# Patient Record
Sex: Male | Born: 2000 | Race: White | Hispanic: Yes | Marital: Single | Smoking: Never smoker
Health system: Southern US, Community
[De-identification: ages and names within clinical notes are randomized; demographics above are authoritative.]

---

## 2022-01-07 ENCOUNTER — Emergency Department (HOSPITAL_COMMUNITY)
Admission: EM | Admit: 2022-01-07 | Discharge: 2022-01-08 | Disposition: A | Discharge status: Home / Self Care | Payer: Self-pay | Attending: Emergency Medicine | Admitting: Emergency Medicine

## 2022-01-07 ENCOUNTER — Encounter (HOSPITAL_COMMUNITY): Payer: Self-pay

## 2022-01-07 ENCOUNTER — Emergency Department (HOSPITAL_COMMUNITY): Payer: Self-pay

## 2022-01-07 ENCOUNTER — Other Ambulatory Visit: Payer: Self-pay

## 2022-01-07 DIAGNOSIS — Y9361 Activity, american tackle football: Secondary | ICD-10-CM | POA: Insufficient documentation

## 2022-01-07 DIAGNOSIS — X501XXA Overexertion from prolonged static or awkward postures, initial encounter: Secondary | ICD-10-CM | POA: Insufficient documentation

## 2022-01-07 DIAGNOSIS — M25562 Pain in left knee: Secondary | ICD-10-CM | POA: Insufficient documentation

## 2022-01-07 MED ORDER — IBUPROFEN 800 MG PO TABS
800.0000 mg | ORAL_TABLET | Freq: Once | ORAL | Status: AC
  Administered 2022-01-07: 800 mg via ORAL
  Filled 2022-01-07: qty 1

## 2022-01-07 NOTE — ED Provider Triage Note (Signed)
Emergency Medicine Provider Triage Evaluation Note ? ?Sergio Wagner , a 21 y.o. male  was evaluated in triage.  Pt complains of left knee pain S/p injury while playing flag football.  Patient states that he was running in his left knee that he went to different directions.  He is having pain to the medial knee, worse with movement, no alleviating factors.. ? ?Review of Systems  ?Positive: Knee pain ?Negative: Numbness, tingling, weakness ? ?Physical Exam  ?BP (!) 144/85 (BP Location: Right Arm)   Pulse 65   Temp 100.3 ?F (37.9 ?C) (Oral)   Resp 16   SpO2 100%  ?Gen:   Awake, no distress   ?Resp:  Normal effort  ?MSK:   Left lower extremity: No significant open wounds.  Intact active range of motion throughout with exception of left knee, flexion/extension mildly limited however is able to move some each direction.  Tender to the medial aspect of the knee.  2+ DP and PT pulses.  Sensation grossly tact bilateral lower extremities.  5-5 strength with plantar dorsiflexion bilaterally. ? ?Medical Decision Making  ?Medically screening exam initiated at 11:37 PM.  Appropriate orders placed.  Sergio Wagner was informed that the remainder of the evaluation will be completed by another provider, this initial triage assessment does not replace that evaluation, and the importance of remaining in the ED until their evaluation is complete. ? ?Knee pain ?  ?Cherly Anderson, PA-C ?01/07/22 2338 ? ?

## 2022-01-07 NOTE — ED Triage Notes (Signed)
Was playing flag footbaLL this evening and he injured the left knee ?

## 2022-01-08 MED ORDER — NAPROXEN 500 MG PO TABS: 500.0000 mg | ORAL_TABLET | Freq: Two times a day (BID) | ORAL | 0 refills | Status: AC | PRN

## 2022-01-08 NOTE — ED Notes (Signed)
All discharge instructions including follow up are and prescriptions reviewed with patient and patient verbalized understanding of same. Patient stable and ambulatory with crutches at time of discharge. Wheel chair offered but patient declined.  ?

## 2022-01-08 NOTE — ED Provider Notes (Signed)
?[facility] EMERGENCY DEPARTMENT ?Provider Note ? ? ?CSN: [id number] ?Arrival date & time: 01/07/22  2248 ? ?  ? ?History ? ?Chief Complaint  ?Patient presents with  ? Knee Injury  ? ? ?Sergio Wagner is a 21 y.o. male without significant past medical history who presents to the emergency department with complaints of left knee pain status post injury while playing flag football earlier today.  Patient states he was running, jump, and when he landed felt like his knee twisted.  He has had pain to the medial aspect of the knee since injury, worse with movement, no alleviating factors.  He denies any other areas of injury.  He denies numbness, tingling, or weakness. ? ?HPI ? ?  ? ?Home Medications ?Prior to Admission medications   ?Medication Sig Start Date End Date Taking? Authorizing Provider  ?naproxen (NAPROSYN) 500 MG tablet Take 1 tablet (500 mg total) by mouth 2 (two) times daily as needed for moderate pain. 01/08/22  Yes Rhyanna Sorce, Pleas Koch, PA-C  ?   ? ?Allergies    ?Patient has no allergy information on record.   ? ?Review of Systems   ?Review of Systems  ?Constitutional:  Negative for chills and fever.  ?Respiratory:  Negative for shortness of breath.   ?Cardiovascular:  Negative for chest pain.  ?Gastrointestinal:  Negative for abdominal pain and vomiting.  ?Musculoskeletal:  Positive for arthralgias.  ?Neurological:  Negative for weakness and numbness.  ?All other systems reviewed and are negative. ? ?Physical Exam ?Updated Vital Signs ?BP (!) 144/85 (BP Location: Right Arm)   Pulse 65   Temp 100.3 ?F (37.9 ?C) (Oral)   Resp 16   Ht 5\' 5"  (1.651 m)   Wt 63.5 kg   SpO2 100%   BMI 23.30 kg/m?  ?Physical Exam ?Vitals and nursing note reviewed.  ?Constitutional:   ?   General: He is not in acute distress. ?   Appearance: He is not ill-appearing or toxic-appearing.  ?HENT:  ?   Head: Normocephalic and atraumatic.  ?Cardiovascular:  ?   Pulses:     ?     Dorsalis pedis pulses are  2+ on the right side and 2+ on the left side.  ?     Posterior tibial pulses are 2+ on the right side and 2+ on the left side.  ?Pulmonary:  ?   Effort: Pulmonary effort is normal.  ?Musculoskeletal:  ?   Comments: Lower extremities: No obvious deformity, appreciable swelling, edema, erythema, ecchymosis, warmth, or open wounds. Patient has intact AROM to bilateral hips, ankles, and all digits as well as to the right knee, left knee is able to be fully extended, however has some mild limitation in flexion, able to flex to about 90 degrees.. Tender to palpation to the medial aspect of the left knee over the medial joint line.  No other tenderness to palpation.  Some pain with valgus stress test, no obvious joint instability.   ?Skin: ?   General: Skin is warm and dry.  ?   Capillary Refill: Capillary refill takes less than 2 seconds.  ?Neurological:  ?   Mental Status: He is alert.  ?   Comments: Alert. Clear speech. Sensation grossly intact to bilateral lower extremities. 5/5 strength with plantar/dorsiflexion bilaterally.  ?Psychiatric:     ?   Mood and Affect: Mood normal.     ?   Behavior: Behavior normal.  ? ? ?ED Results / Procedures / Treatments   ?Labs ?(all  labs ordered are listed, but only abnormal results are displayed) ?Labs Reviewed - No data to display ? ?EKG ?None ? ?Radiology ?DG Knee Complete 4 Views Left ? ?Result Date: 01/07/2022 ?CLINICAL DATA:  Left knee pain after injury playing football today. EXAM: LEFT KNEE - COMPLETE 4+ VIEW COMPARISON:  None. FINDINGS: No evidence of fracture, dislocation, or joint effusion. Normal alignment and joint spaces. No evidence of arthropathy or other focal bone abnormality. Soft tissues are unremarkable. IMPRESSION: Negative radiographs of the left knee. Electronically Signed   By: Narda Rutherford M.D.   On: 01/07/2022 23:58   ? ?Procedures ?Procedures  ? ? ?Medications Ordered in ED ?Medications  ?ibuprofen (ADVIL) tablet 800 mg (800 mg Oral Given 01/07/22 2341)   ? ? ?ED Course/ Medical Decision Making/ A&P ?  ?                        ?Medical Decision Making ?Amount and/or Complexity of Data Reviewed ?Radiology: ordered. ? ?Risk ?Prescription drug management. ? ? ?Patient presents to the emergency department with complaints of left knee pain status post injury.  Nontoxic, resting comfortably, vitals with elevated BP, doubt hypertensive emergency, initial oral temp recorded 100.3, recheck temp recently was 98.7-patient has not had fever or additional symptoms at home.  ? ?Chart and nursing notes reviewed for additional history. ? ?I ordered an x-ray of the left knee which I viewed and interpreted, agree with radiologist interpretation-negative radiographs of the left knee ? ?Patient is neurovascularly intact distally.  There are no overlying signs of infection.  Possible ligamentous/meniscal injury.  Will place in knee immobilizer, provide crutches, and have patient follow-up with orthopedics. Discussed PRICE, prescription provided for naproxen. I discussed results, treatment plan, need for follow-up, and return precautions with the patient. Provided opportunity for questions, patient confirmed understanding and is in agreement with plan.  ? ?Final Clinical Impression(s) / ED Diagnoses ?Final diagnoses:  ?Acute pain of left knee  ? ? ?Rx / DC Orders ?ED Discharge Orders   ? ?      Ordered  ?  naproxen (NAPROSYN) 500 MG tablet  2 times daily PRN       ? 01/08/22 0125  ? ?  ?  ? ?  ? ? ?  ?Cherly Anderson, PA-C ?01/08/22 0132 ? ?  ?Sabas Sous, MD ?01/08/22 [phone] ? ?

## 2022-01-08 NOTE — Discharge Instructions (Signed)
Please read and follow all provided instructions. ? ?You have been seen today for an injury to your left knee ? ?Tests performed today include: ?An x-ray of the affected area - does NOT show any broken bones or dislocations.  ?Vital signs. See below for your results today.  ? ?Home care instructions: -- *PRICE in the first 24-48 hours after injury: ?Protect (with brace, splint, sling), if given by your provider ?Rest ?Ice- Do not apply ice pack directly to your skin, place towel or similar between your skin and ice/ice pack. Apply ice for 20 min, then remove for 40 min while awake ?Compression- Wear brace, elastic bandage, splint as directed by your provider ?Elevate affected extremity above the level of your heart when not walking around for the first 24-48 hours  ? ?Medications:  ?- Naproxen is a nonsteroidal anti-inflammatory medication that will help with pain and swelling. Be sure to take this medication as prescribed with food, 1 pill every 12 hours,  It should be taken with food, as it can cause stomach upset, and more seriously, stomach bleeding. Do not take other nonsteroidal anti-inflammatory medications with this such as Advil, Motrin, Aleve, Mobic, Goodie Powder, or Motrin.   ? ?You make take Tylenol per over the counter dosing with these medications.  ? ?We have prescribed you new medication(s) today. Discuss the medications prescribed today with your pharmacist as they can have adverse effects and interactions with your other medicines including over the counter and prescribed medications. Seek medical evaluation if you start to experience new or abnormal symptoms after taking one of these medicines, seek care immediately if you start to experience difficulty breathing, feeling of your throat closing, facial swelling, or rash as these could be indications of a more serious allergic reaction ? ?Follow-up instructions: ?Please follow-up with the orthopedic surgeon within 1 week in this case you have a  more severe injury that requires further care. In the mean time utilize the brace and use the crutches as needed.  ? ?Return instructions:  ?Please return if your digits or extremity are numb or tingling, appear gray or blue, or you have severe pain (also elevate the extremity and loosen splint or wrap if you were given one) ?Please return if you have redness or fevers.  ?Please return to the Emergency Department if you experience worsening symptoms.  ?Please return if you have any other emergent concerns. ?Additional Information: ? ?Your vital signs today were: ?BP (!) 144/85 (BP Location: Right Arm)   Pulse 65   Temp 100.3 ?F (37.9 ?C) (Oral)   Resp 16   Ht 5\' 5"  (1.651 m)   Wt 63.5 kg   SpO2 100%   BMI 23.30 kg/m?  ?If your blood pressure (BP) was elevated above 135/85 this visit, please have this repeated by your doctor within one month. ?---------------  ?

## 2023-04-09 IMAGING — DX DG KNEE COMPLETE 4+V*L*
4 series · 4 of 4 positions shown · non-contrast
Comparison: None.

CLINICAL DATA: Left knee pain after injury playing football today.

EXAM:
LEFT KNEE - COMPLETE 4+ VIEW

[knee ap]
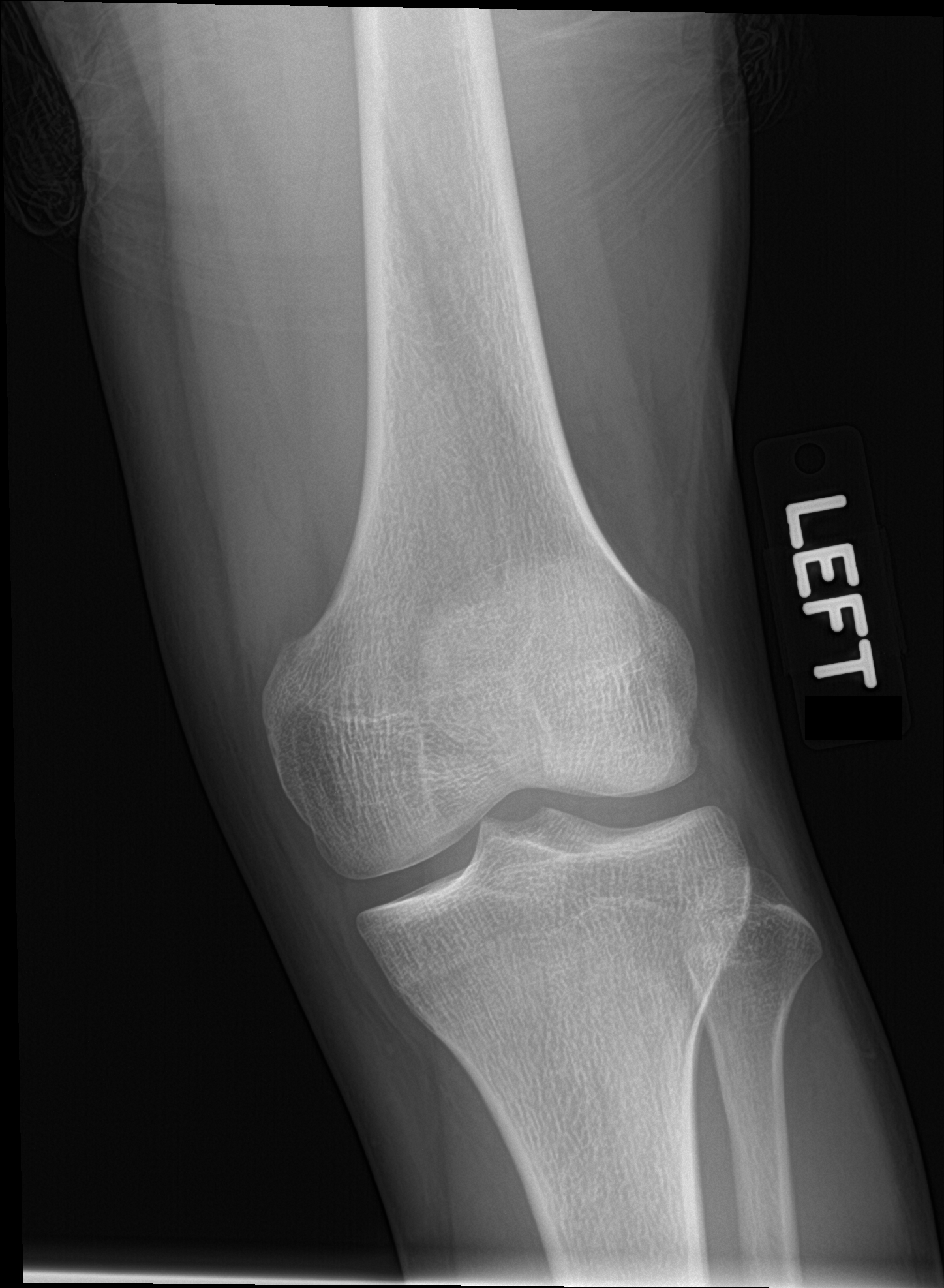

[knee lat]
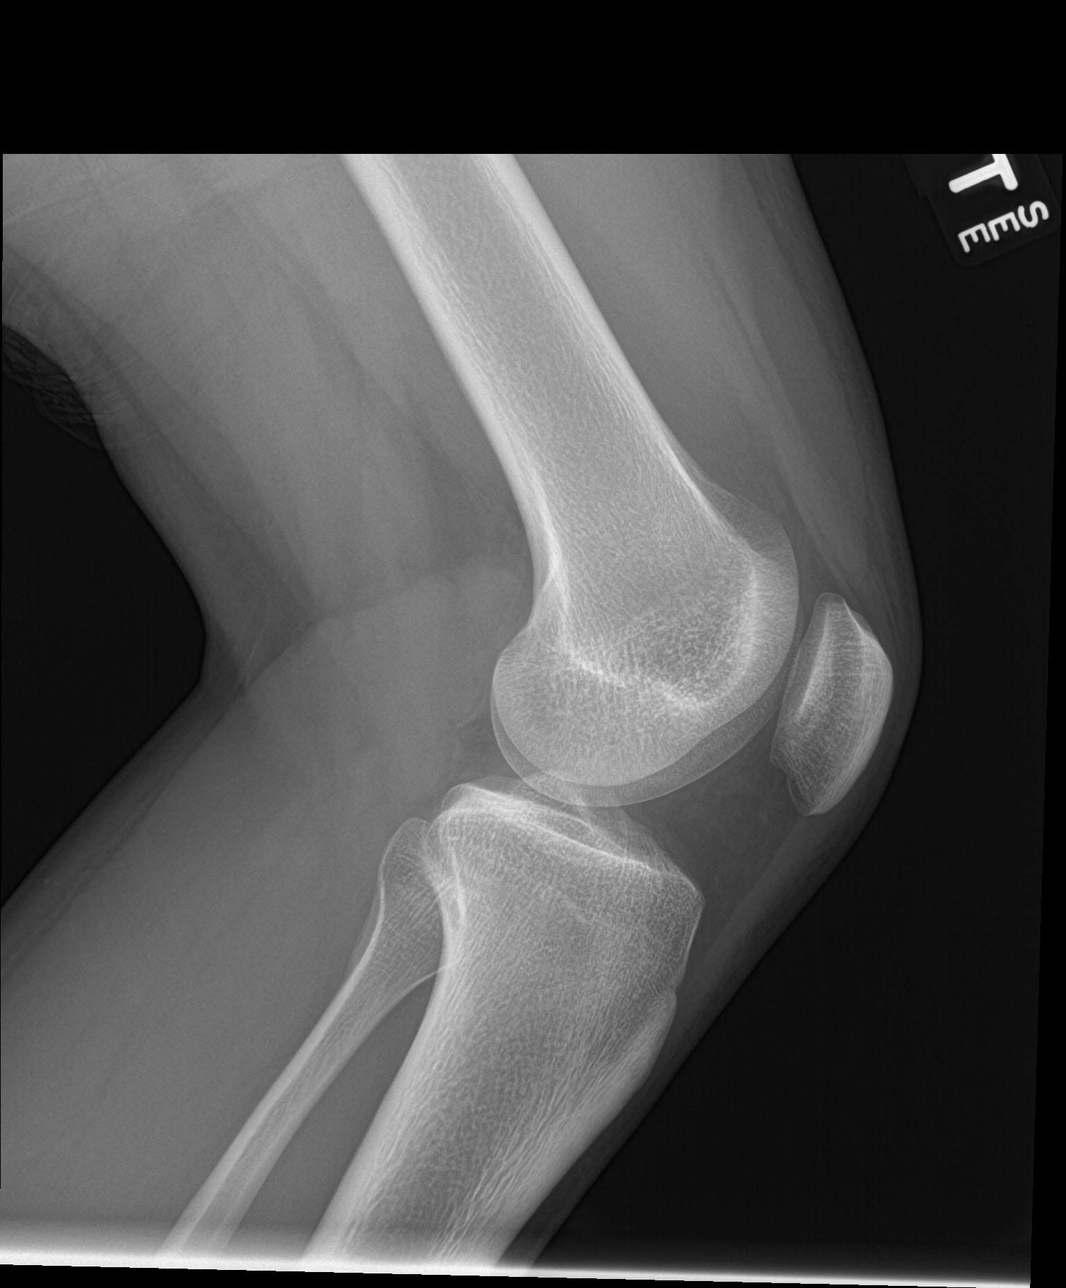

[knee obl (1 of 2)]
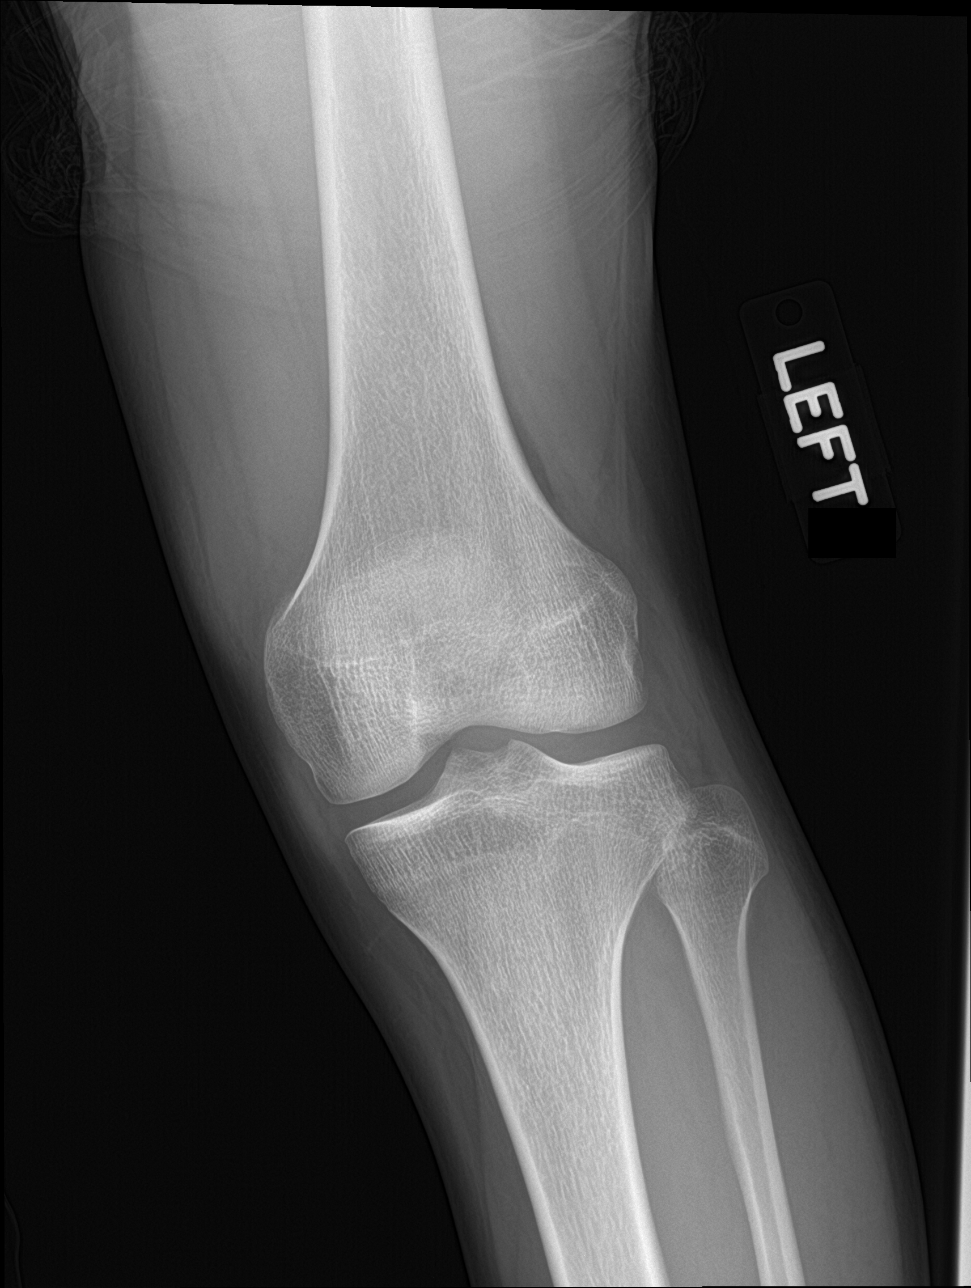

[knee obl (2 of 2)]
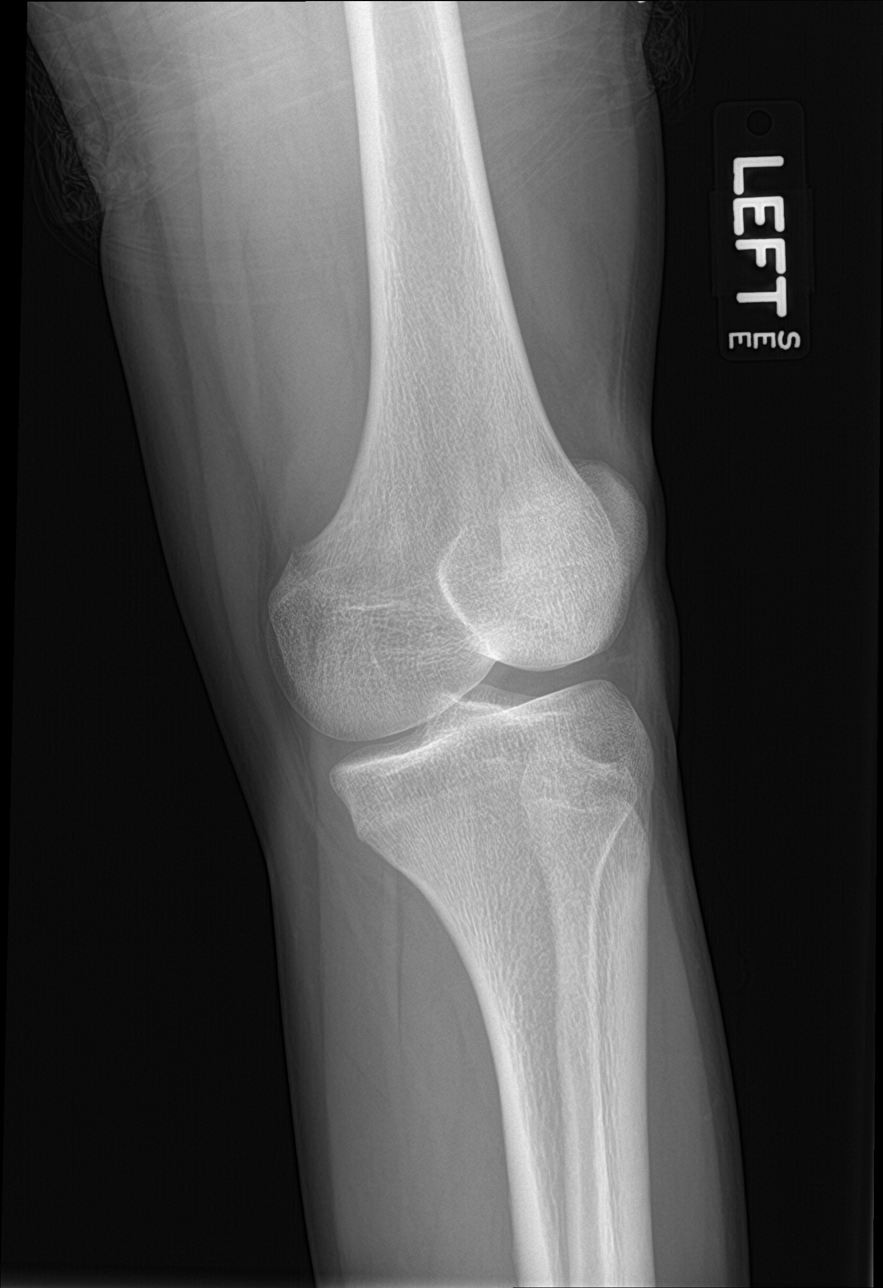

[4 of 4 positions shown; findings below may reference images not displayed]

FINDINGS: No evidence of fracture, dislocation, or joint effusion. Normal
alignment and joint spaces. No evidence of arthropathy or other
focal bone abnormality. Soft tissues are unremarkable.
IMPRESSION: Negative radiographs of the left knee.
